# Patient Record
Sex: Male | Born: 1980 | Race: Black or African American | Hispanic: No | Marital: Single | Smoking: Former smoker
Health system: Southern US, Community
[De-identification: ages and names within clinical notes are randomized; demographics above are authoritative.]

## PROBLEM LIST (undated history)

## (undated) DIAGNOSIS — T1490XA Injury, unspecified, initial encounter: Secondary | ICD-10-CM

## (undated) DIAGNOSIS — R569 Unspecified convulsions: Secondary | ICD-10-CM

## (undated) DIAGNOSIS — W3400XA Accidental discharge from unspecified firearms or gun, initial encounter: Secondary | ICD-10-CM

## (undated) HISTORY — DX: Injury, unspecified, initial encounter: T14.90XA

---

## 1998-10-14 ENCOUNTER — Emergency Department (HOSPITAL_COMMUNITY): Admission: EM | Admit: 1998-10-14 | Discharge: 1998-10-14 | Payer: Self-pay | Admitting: Emergency Medicine

## 1998-10-15 ENCOUNTER — Emergency Department (HOSPITAL_COMMUNITY): Admission: EM | Admit: 1998-10-15 | Discharge: 1998-10-15 | Payer: Self-pay | Admitting: Emergency Medicine

## 1998-10-15 ENCOUNTER — Encounter: Payer: Self-pay | Admitting: Emergency Medicine

## 2000-05-08 HISTORY — PX: CRANIOTOMY: SHX93

## 2000-07-04 ENCOUNTER — Inpatient Hospital Stay (HOSPITAL_COMMUNITY): Admission: EM | Admit: 2000-07-04 | Discharge: 2000-07-10 | Payer: Self-pay

## 2000-07-04 ENCOUNTER — Encounter: Payer: Self-pay | Admitting: Emergency Medicine

## 2000-07-05 ENCOUNTER — Encounter: Payer: Self-pay | Admitting: General Surgery

## 2000-07-06 ENCOUNTER — Encounter: Payer: Self-pay | Admitting: General Surgery

## 2000-07-07 ENCOUNTER — Encounter: Payer: Self-pay | Admitting: General Surgery

## 2000-07-10 ENCOUNTER — Inpatient Hospital Stay (HOSPITAL_COMMUNITY)
Admission: AD | Admit: 2000-07-10 | Discharge: 2000-07-25 | Payer: Self-pay | Admitting: Physical Medicine and Rehabilitation

## 2000-07-11 ENCOUNTER — Encounter: Payer: Self-pay | Admitting: Physical Medicine and Rehabilitation

## 2000-07-14 ENCOUNTER — Encounter: Payer: Self-pay | Admitting: Physical Medicine and Rehabilitation

## 2000-07-30 ENCOUNTER — Encounter
Admission: RE | Admit: 2000-07-30 | Discharge: 2000-10-04 | Payer: Self-pay | Admitting: Physical Medicine and Rehabilitation

## 2000-09-11 ENCOUNTER — Emergency Department (HOSPITAL_COMMUNITY): Admission: EM | Admit: 2000-09-11 | Discharge: 2000-09-11 | Payer: Self-pay | Admitting: Emergency Medicine

## 2000-09-13 ENCOUNTER — Ambulatory Visit (HOSPITAL_COMMUNITY)
Admission: RE | Admit: 2000-09-13 | Discharge: 2000-09-13 | Payer: Self-pay | Admitting: Physical Medicine and Rehabilitation

## 2000-09-13 ENCOUNTER — Emergency Department (HOSPITAL_COMMUNITY): Admission: EM | Admit: 2000-09-13 | Discharge: 2000-09-13 | Payer: Self-pay | Admitting: Emergency Medicine

## 2000-10-31 ENCOUNTER — Encounter: Admission: RE | Admit: 2000-10-31 | Discharge: 2000-10-31 | Payer: Self-pay | Admitting: Neurosurgery

## 2000-10-31 ENCOUNTER — Encounter: Payer: Self-pay | Admitting: Neurosurgery

## 2000-11-20 ENCOUNTER — Inpatient Hospital Stay (HOSPITAL_COMMUNITY): Admission: RE | Admit: 2000-11-20 | Discharge: 2000-11-21 | Payer: Self-pay | Admitting: Neurosurgery

## 2001-01-02 ENCOUNTER — Ambulatory Visit (HOSPITAL_COMMUNITY): Admission: RE | Admit: 2001-01-02 | Discharge: 2001-01-02 | Payer: Self-pay | Admitting: Neurosurgery

## 2001-11-18 ENCOUNTER — Encounter: Payer: Self-pay | Admitting: *Deleted

## 2001-11-18 ENCOUNTER — Emergency Department (HOSPITAL_COMMUNITY): Admission: EM | Admit: 2001-11-18 | Discharge: 2001-11-18 | Payer: Self-pay | Admitting: Emergency Medicine

## 2002-02-09 ENCOUNTER — Emergency Department (HOSPITAL_COMMUNITY): Admission: EM | Admit: 2002-02-09 | Discharge: 2002-02-09 | Payer: Self-pay | Admitting: Emergency Medicine

## 2002-04-21 ENCOUNTER — Encounter: Payer: Self-pay | Admitting: Emergency Medicine

## 2002-04-21 ENCOUNTER — Emergency Department (HOSPITAL_COMMUNITY): Admission: EM | Admit: 2002-04-21 | Discharge: 2002-04-21 | Payer: Self-pay | Admitting: Emergency Medicine

## 2002-09-08 ENCOUNTER — Emergency Department (HOSPITAL_COMMUNITY): Admission: EM | Admit: 2002-09-08 | Discharge: 2002-09-09 | Payer: Self-pay | Admitting: Emergency Medicine

## 2002-12-19 ENCOUNTER — Encounter: Payer: Self-pay | Admitting: Emergency Medicine

## 2002-12-19 ENCOUNTER — Encounter: Payer: Self-pay | Admitting: Internal Medicine

## 2002-12-19 ENCOUNTER — Inpatient Hospital Stay (HOSPITAL_COMMUNITY): Admission: EM | Admit: 2002-12-19 | Discharge: 2002-12-22 | Payer: Self-pay | Admitting: Emergency Medicine

## 2004-03-30 ENCOUNTER — Ambulatory Visit: Payer: Self-pay | Admitting: Family Medicine

## 2004-07-29 ENCOUNTER — Ambulatory Visit: Payer: Self-pay | Admitting: Sports Medicine

## 2005-08-28 ENCOUNTER — Emergency Department (HOSPITAL_COMMUNITY): Admission: EM | Admit: 2005-08-28 | Discharge: 2005-08-28 | Payer: Self-pay | Admitting: Emergency Medicine

## 2006-01-27 ENCOUNTER — Emergency Department (HOSPITAL_COMMUNITY): Admission: EM | Admit: 2006-01-27 | Discharge: 2006-01-27 | Payer: Self-pay | Admitting: Emergency Medicine

## 2006-07-05 DIAGNOSIS — R569 Unspecified convulsions: Secondary | ICD-10-CM | POA: Insufficient documentation

## 2006-07-05 DIAGNOSIS — F79 Unspecified intellectual disabilities: Secondary | ICD-10-CM | POA: Insufficient documentation

## 2006-07-27 ENCOUNTER — Emergency Department (HOSPITAL_COMMUNITY): Admission: EM | Admit: 2006-07-27 | Discharge: 2006-07-27 | Payer: Self-pay | Admitting: Emergency Medicine

## 2006-08-08 ENCOUNTER — Ambulatory Visit: Payer: Self-pay | Admitting: Family Medicine

## 2006-08-10 ENCOUNTER — Ambulatory Visit: Payer: Self-pay | Admitting: Family Medicine

## 2008-08-13 ENCOUNTER — Emergency Department (HOSPITAL_COMMUNITY): Admission: EM | Admit: 2008-08-13 | Discharge: 2008-08-13 | Payer: Self-pay | Admitting: Emergency Medicine

## 2010-08-17 LAB — DIFFERENTIAL
Basophils Absolute: 0 10*3/uL (ref 0.0–0.1)
Basophils Relative: 1 % (ref 0–1)
Monocytes Absolute: 0.3 10*3/uL (ref 0.1–1.0)
Monocytes Relative: 5 % (ref 3–12)
Neutro Abs: 3.4 10*3/uL (ref 1.7–7.7)

## 2010-08-17 LAB — RAPID URINE DRUG SCREEN, HOSP PERFORMED
Cocaine: NOT DETECTED
Opiates: NOT DETECTED

## 2010-08-17 LAB — ETHANOL: Alcohol, Ethyl (B): 248 mg/dL — ABNORMAL HIGH (ref 0–10)

## 2010-08-17 LAB — BASIC METABOLIC PANEL
Calcium: 8.1 mg/dL — ABNORMAL LOW (ref 8.4–10.5)
GFR calc Af Amer: >60 mL/min (ref >60)
Potassium: 3.4 mEq/L — ABNORMAL LOW (ref 3.5–5.1)

## 2010-08-17 LAB — CBC
HCT: 40.8 % (ref 39.0–52.0)
MCHC: 33.9 g/dL (ref 30.0–36.0)
Platelets: 144 10*3/uL — ABNORMAL LOW (ref 150–400)
RBC: 4.46 MIL/uL (ref 4.22–5.81)
RDW: 13.6 % (ref 11.5–15.5)

## 2010-08-17 LAB — POCT I-STAT 3, ART BLOOD GAS (G3+)
Acid-base deficit: 3 mmol/L — ABNORMAL HIGH (ref 0.0–2.0)
O2 Saturation: 100 %
Patient temperature: 98.6
TCO2: 26 mmol/L (ref 0–100)
pCO2 arterial: 48.4 mmHg — ABNORMAL HIGH (ref 35.0–45.0)
pO2, Arterial: 576 mmHg — ABNORMAL HIGH (ref 80.0–100.0)

## 2010-08-17 LAB — CARBAMAZEPINE LEVEL, TOTAL: Carbamazepine Lvl: <2 ug/mL — ABNORMAL LOW (ref 4.0–12.0)

## 2010-08-17 LAB — LACTIC ACID, PLASMA: Lactic Acid, Venous: 1.7 mmol/L (ref 0.5–2.2)

## 2010-08-17 LAB — CK: Total CK: 338 U/L — ABNORMAL HIGH (ref 7–232)
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
## 2011-02-10 ENCOUNTER — Inpatient Hospital Stay (INDEPENDENT_AMBULATORY_CARE_PROVIDER_SITE_OTHER)
Admission: RE | Admit: 2011-02-10 | Discharge: 2011-02-10 | Disposition: A | Discharge status: Home / Self Care | Payer: Medicare Other | Source: Ambulatory Visit | Attending: Family Medicine | Admitting: Family Medicine

## 2011-02-10 DIAGNOSIS — S025XXA Fracture of tooth (traumatic), initial encounter for closed fracture: Secondary | ICD-10-CM

## 2011-02-10 DIAGNOSIS — K089 Disorder of teeth and supporting structures, unspecified: Secondary | ICD-10-CM

## 2011-03-15 ENCOUNTER — Encounter: Payer: Self-pay | Admitting: *Deleted

## 2011-03-15 ENCOUNTER — Emergency Department (HOSPITAL_COMMUNITY)
Admission: EM | Admit: 2011-03-15 | Discharge: 2011-03-15 | Disposition: A | Discharge status: Home / Self Care | Payer: Medicare Other | Attending: Emergency Medicine | Admitting: Emergency Medicine

## 2011-03-15 DIAGNOSIS — G40909 Epilepsy, unspecified, not intractable, without status epilepticus: Secondary | ICD-10-CM | POA: Insufficient documentation

## 2011-03-15 DIAGNOSIS — Z760 Encounter for issue of repeat prescription: Secondary | ICD-10-CM | POA: Insufficient documentation

## 2011-03-15 DIAGNOSIS — F17200 Nicotine dependence, unspecified, uncomplicated: Secondary | ICD-10-CM | POA: Insufficient documentation

## 2011-03-15 HISTORY — DX: Unspecified convulsions: R56.9

## 2011-03-15 MED ORDER — CARBAMAZEPINE 200 MG PO TABS: 200.0000 mg | ORAL_TABLET | Freq: Three times a day (TID) | ORAL | Status: DC

## 2011-03-15 MED ORDER — PHENYTOIN SODIUM EXTENDED 100 MG PO CAPS: 300.0000 mg | ORAL_CAPSULE | Freq: Every day | ORAL | Status: DC

## 2011-03-15 MED ORDER — DONEPEZIL HCL 10 MG PO TABS: 10.0000 mg | ORAL_TABLET | Freq: Every evening | ORAL | Status: AC | PRN
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
## 2011-03-15 NOTE — ED Notes (Signed)
Pt states "out of aricept x 3 days"

## 2011-03-16 ENCOUNTER — Other Ambulatory Visit: Payer: Self-pay

## 2011-03-16 ENCOUNTER — Emergency Department (HOSPITAL_COMMUNITY): Payer: Medicare Other

## 2011-03-16 ENCOUNTER — Emergency Department (HOSPITAL_COMMUNITY)
Admission: EM | Admit: 2011-03-16 | Discharge: 2011-03-16 | Disposition: A | Discharge status: Home / Self Care | Payer: Medicare Other | Attending: Emergency Medicine | Admitting: Emergency Medicine

## 2011-03-16 ENCOUNTER — Encounter (HOSPITAL_COMMUNITY): Payer: Self-pay | Admitting: Emergency Medicine

## 2011-03-16 DIAGNOSIS — J3489 Other specified disorders of nose and nasal sinuses: Secondary | ICD-10-CM | POA: Insufficient documentation

## 2011-03-16 DIAGNOSIS — K029 Dental caries, unspecified: Secondary | ICD-10-CM | POA: Insufficient documentation

## 2011-03-16 DIAGNOSIS — R279 Unspecified lack of coordination: Secondary | ICD-10-CM | POA: Insufficient documentation

## 2011-03-16 DIAGNOSIS — F17200 Nicotine dependence, unspecified, uncomplicated: Secondary | ICD-10-CM | POA: Insufficient documentation

## 2011-03-16 DIAGNOSIS — R4182 Altered mental status, unspecified: Secondary | ICD-10-CM | POA: Insufficient documentation

## 2011-03-16 DIAGNOSIS — H5789 Other specified disorders of eye and adnexa: Secondary | ICD-10-CM | POA: Insufficient documentation

## 2011-03-16 LAB — RAPID URINE DRUG SCREEN, HOSP PERFORMED
Amphetamines: NOT DETECTED
Barbiturates: NOT DETECTED
Benzodiazepines: NOT DETECTED
Cocaine: NOT DETECTED
Opiates: NOT DETECTED
Tetrahydrocannabinol: POSITIVE — AB

## 2011-03-16 LAB — CBC
Hemoglobin: 14 g/dL (ref 13.0–17.0)
MCH: 30.4 pg (ref 26.0–34.0)
MCV: 90 fL (ref 78.0–100.0)
RBC: 4.61 MIL/uL (ref 4.22–5.81)

## 2011-03-16 LAB — DIFFERENTIAL
Basophils Absolute: 0 10*3/uL (ref 0.0–0.1)
Eosinophils Relative: 1 % (ref 0–5)
Monocytes Absolute: 0.2 10*3/uL (ref 0.1–1.0)
Monocytes Relative: 4 % (ref 3–12)
Neutro Abs: 3.3 10*3/uL (ref 1.7–7.7)
Neutrophils Relative %: 71 % (ref 43–77)

## 2011-03-16 LAB — COMPREHENSIVE METABOLIC PANEL
Creatinine, Ser: 0.82 mg/dL (ref 0.50–1.35)
Potassium: 3.8 mEq/L (ref 3.5–5.1)
Total Bilirubin: 0.4 mg/dL (ref 0.3–1.2)
Total Protein: 7.2 g/dL (ref 6.0–8.3)
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
## 2011-03-16 NOTE — ED Notes (Signed)
Patient given food tray

## 2011-03-16 NOTE — ED Notes (Signed)
In to ask patient did he want to eat. He stated that his people was bringing him something to eat because he told them that he would be ready to go by 4pm. States that he is having surgery on his head and his dad is coming by to check on him due to them being on a job earlier. Informed Dr. Hyman Hopes of conversation.

## 2011-03-16 NOTE — ED Notes (Signed)
Arrived to the ER with sister. Stating that the patient had slurred speech for the last 2 days. Was seen in the ER on 03/15/11. States that he was given new meds and has had slurred speech since taking the new meds.

## 2011-03-16 NOTE — ED Notes (Signed)
Family at bedside. Gave water as patient requested.

## 2011-03-16 NOTE — ED Notes (Signed)
Pt resting at present and no signs of distress. Sister Marylene Land called to check in on him and states that she will be back later.

## 2011-03-16 NOTE — ED Notes (Signed)
Transported to CT 

## 2011-03-16 NOTE — ED Notes (Signed)
Returned from CT.

## 2011-03-21 LAB — PHENYTOIN LEVEL, FREE AND TOTAL
Phenytoin Bound: 4.6 mg/L
Phenytoin, Total: 5.8 mg/L — ABNORMAL LOW (ref 10.0–20.0)

## 2012-02-19 ENCOUNTER — Encounter (HOSPITAL_COMMUNITY): Payer: Self-pay

## 2012-02-19 ENCOUNTER — Emergency Department (HOSPITAL_COMMUNITY)
Admission: EM | Admit: 2012-02-19 | Discharge: 2012-02-19 | Disposition: A | Discharge status: Home / Self Care | Payer: Medicare Other | Attending: Emergency Medicine | Admitting: Emergency Medicine

## 2012-02-19 DIAGNOSIS — Z87820 Personal history of traumatic brain injury: Secondary | ICD-10-CM | POA: Insufficient documentation

## 2012-02-19 DIAGNOSIS — Z23 Encounter for immunization: Secondary | ICD-10-CM | POA: Insufficient documentation

## 2012-02-19 DIAGNOSIS — S0180XA Unspecified open wound of other part of head, initial encounter: Secondary | ICD-10-CM | POA: Insufficient documentation

## 2012-02-19 DIAGNOSIS — X58XXXA Exposure to other specified factors, initial encounter: Secondary | ICD-10-CM | POA: Insufficient documentation

## 2012-02-19 DIAGNOSIS — Z79899 Other long term (current) drug therapy: Secondary | ICD-10-CM | POA: Insufficient documentation

## 2012-02-19 DIAGNOSIS — IMO0002 Reserved for concepts with insufficient information to code with codable children: Secondary | ICD-10-CM

## 2012-02-19 MED ORDER — TRAMADOL HCL 50 MG PO TABS
50.0000 mg | ORAL_TABLET | Freq: Once | ORAL | Status: DC
Start: 2012-02-19 — End: 2012-02-19

## 2012-02-19 MED ORDER — TETANUS-DIPHTH-ACELL PERTUSSIS 5-2.5-18.5 LF-MCG/0.5 IM SUSP
0.5000 mL | Freq: Once | INTRAMUSCULAR | Status: AC
  Administered 2012-02-19: 0.5 mL via INTRAMUSCULAR
  Filled 2012-02-19: qty 0.5

## 2012-02-19 MED ORDER — DEXAMETHASONE 6 MG PO TABS
6.0000 mg | ORAL_TABLET | Freq: Once | ORAL | Status: DC
  Filled 2012-02-19: qty 1

## 2012-02-19 MED ORDER — KETOROLAC TROMETHAMINE 30 MG/ML IJ SOLN: 30.0000 mg | Freq: Once | INTRAMUSCULAR | Status: DC

## 2012-02-19 MED ORDER — ONDANSETRON 4 MG PO TBDP: 4.0000 mg | ORAL_TABLET | Freq: Once | ORAL | Status: DC
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
## 2012-02-19 NOTE — ED Provider Notes (Signed)
Medical screening examination/treatment/procedure(s) were performed by non-physician practitioner and as supervising physician I was immediately available for consultation/collaboration.    Yann Biehn R Cheila Wickstrom, MD 02/19/12 1612 
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
## 2012-11-25 ENCOUNTER — Encounter: Payer: Self-pay | Admitting: Neurology

## 2012-11-26 ENCOUNTER — Ambulatory Visit: Payer: Self-pay | Admitting: Neurology

## 2013-04-22 ENCOUNTER — Ambulatory Visit: Payer: Self-pay | Admitting: Neurology

## 2013-05-12 ENCOUNTER — Telehealth: Payer: Self-pay | Admitting: *Deleted

## 2013-06-25 ENCOUNTER — Ambulatory Visit: Payer: Self-pay | Admitting: Neurology

## 2017-02-19 NOTE — Telephone Encounter (Signed)
Close Encounter 

## 2018-06-08 ENCOUNTER — Encounter (HOSPITAL_COMMUNITY): Payer: Self-pay | Admitting: Emergency Medicine

## 2018-06-08 ENCOUNTER — Emergency Department (HOSPITAL_COMMUNITY)
Admission: EM | Admit: 2018-06-08 | Discharge: 2018-06-08 | Disposition: A | Discharge status: Home / Self Care | Payer: Medicare Other | Attending: Emergency Medicine | Admitting: Emergency Medicine

## 2018-06-08 ENCOUNTER — Other Ambulatory Visit: Payer: Self-pay

## 2018-06-08 ENCOUNTER — Emergency Department (HOSPITAL_COMMUNITY): Payer: Medicare Other

## 2018-06-08 DIAGNOSIS — R51 Headache: Secondary | ICD-10-CM | POA: Diagnosis not present

## 2018-06-08 DIAGNOSIS — W19XXXA Unspecified fall, initial encounter: Secondary | ICD-10-CM

## 2018-06-08 LAB — URINALYSIS, ROUTINE W REFLEX MICROSCOPIC
Bilirubin Urine: NEGATIVE
GLUCOSE, UA: NEGATIVE mg/dL
HGB URINE DIPSTICK: NEGATIVE
Ketones, ur: 80 mg/dL — AB
Leukocytes, UA: NEGATIVE
NITRITE: NEGATIVE
PH: 5 (ref 5.0–8.0)
Protein, ur: 30 mg/dL — AB
SPECIFIC GRAVITY, URINE: 1.027 (ref 1.005–1.030)

## 2018-06-08 LAB — RAPID URINE DRUG SCREEN, HOSP PERFORMED
AMPHETAMINES: NOT DETECTED
Barbiturates: NOT DETECTED
Benzodiazepines: NOT DETECTED
Cocaine: NOT DETECTED
Opiates: NOT DETECTED
TETRAHYDROCANNABINOL: POSITIVE — AB

## 2018-06-08 LAB — CBC
HEMATOCRIT: 45.4 % (ref 39.0–52.0)
HEMOGLOBIN: 14.3 g/dL (ref 13.0–17.0)
MCH: 29.9 pg (ref 26.0–34.0)
MCHC: 31.5 g/dL (ref 30.0–36.0)
MCV: 94.8 fL (ref 80.0–100.0)
Platelets: 174 10*3/uL (ref 150–400)
RBC: 4.79 MIL/uL (ref 4.22–5.81)
RDW: 12.7 % (ref 11.5–15.5)
WBC: 5.1 10*3/uL (ref 4.0–10.5)
nRBC: 0 % (ref 0.0–0.2)

## 2018-06-08 LAB — COMPREHENSIVE METABOLIC PANEL
ALBUMIN: 4.3 g/dL (ref 3.5–5.0)
ALK PHOS: 46 U/L (ref 38–126)
ALT: 15 U/L (ref 0–44)
ANION GAP: 11 (ref 5–15)
AST: 15 U/L (ref 15–41)
BILIRUBIN TOTAL: 0.5 mg/dL (ref 0.3–1.2)
BUN: 10 mg/dL (ref 6–20)
CALCIUM: 9.2 mg/dL (ref 8.9–10.3)
CO2: 26 mmol/L (ref 22–32)
Chloride: 103 mmol/L (ref 98–111)
Creatinine, Ser: 0.87 mg/dL (ref 0.61–1.24)
GFR calc Af Amer: >60 mL/min (ref >60)
GLUCOSE: 105 mg/dL — AB (ref 70–99)
Potassium: 3.7 mmol/L (ref 3.5–5.1)
Sodium: 140 mmol/L (ref 135–145)
TOTAL PROTEIN: 7.2 g/dL (ref 6.5–8.1)

## 2018-06-08 LAB — CARBAMAZEPINE LEVEL, TOTAL

## 2018-06-08 LAB — TROPONIN I: Troponin I: <0.03 ng/mL (ref <0.03)

## 2018-06-08 LAB — PHENYTOIN LEVEL, TOTAL: Phenytoin Lvl: 43.8 ug/mL (ref 10.0–20.0)

## 2018-06-08 MED ORDER — DIPHENHYDRAMINE HCL 50 MG/ML IJ SOLN
25.0000 mg | Freq: Once | INTRAMUSCULAR | Status: AC
  Administered 2018-06-08: 25 mg via INTRAVENOUS
  Filled 2018-06-08: qty 1

## 2018-06-08 MED ORDER — PROCHLORPERAZINE EDISYLATE 10 MG/2ML IJ SOLN
10.0000 mg | Freq: Once | INTRAMUSCULAR | Status: AC
  Administered 2018-06-08: 10 mg via INTRAVENOUS
  Filled 2018-06-08: qty 2

## 2018-06-08 MED ORDER — CARBAMAZEPINE 200 MG PO TABS
200.0000 mg | ORAL_TABLET | Freq: Once | ORAL | Status: AC
  Administered 2018-06-08: 200 mg via ORAL
  Filled 2018-06-08: qty 1

## 2018-06-08 MED ORDER — SODIUM CHLORIDE 0.9 % IV BOLUS
1000.0000 mL | Freq: Once | INTRAVENOUS | Status: AC
  Administered 2018-06-08: 1000 mL via INTRAVENOUS

## 2018-06-08 MED ORDER — PHENYTOIN SODIUM EXTENDED 100 MG PO CAPS
100.0000 mg | ORAL_CAPSULE | Freq: Once | ORAL | Status: AC
  Administered 2018-06-08: 100 mg via ORAL
  Filled 2018-06-08: qty 1

## 2018-06-08 NOTE — Discharge Instructions (Addendum)
Procedures  You were evaluated in the Emergency Department and after careful evaluation, we did not find any emergent condition requiring admission or further testing in the hospital.  Your Dilantin levels were high and your Tegretol levels were low.  Please follow-up with your regular prescriber to adjust your doses.  Please return to the Emergency Department if you experience any worsening of your condition.  We encourage you to follow up with a primary care provider.  Thank you for allowing [place] to be a part of your care.
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
## 2018-06-08 NOTE — ED Notes (Signed)
Patient verbalizes understanding of discharge instructions. Opportunity for questioning and answers were provided. Armband removed by staff, pt discharged from ED.  

## 2018-06-08 NOTE — ED Triage Notes (Signed)
Patient reports falling the morning. Headache reported after the fall. Vomited twice yesterday, none today. Headache still present History of seizures, states that he did not take medication today because of headache. Denies hitting head, denies loss of consciousness. Reports frontal headache.

## 2018-06-08 NOTE — ED Notes (Signed)
Patient transported to CT 

## 2018-06-08 NOTE — ED Notes (Signed)
Patient aware that we need a urine sample. 

## 2018-06-08 NOTE — ED Notes (Signed)
ED Provider, Bero at bedside.

## 2020-03-25 ENCOUNTER — Emergency Department (HOSPITAL_BASED_OUTPATIENT_CLINIC_OR_DEPARTMENT_OTHER): Payer: Medicare Other

## 2020-03-25 ENCOUNTER — Emergency Department (HOSPITAL_BASED_OUTPATIENT_CLINIC_OR_DEPARTMENT_OTHER)
Admission: EM | Admit: 2020-03-25 | Discharge: 2020-03-25 | Disposition: A | Discharge status: Home / Self Care | Payer: Medicare Other | Attending: Emergency Medicine | Admitting: Emergency Medicine

## 2020-03-25 ENCOUNTER — Other Ambulatory Visit: Payer: Self-pay

## 2020-03-25 ENCOUNTER — Encounter (HOSPITAL_BASED_OUTPATIENT_CLINIC_OR_DEPARTMENT_OTHER): Payer: Self-pay

## 2020-03-25 DIAGNOSIS — Z87891 Personal history of nicotine dependence: Secondary | ICD-10-CM | POA: Diagnosis not present

## 2020-03-25 DIAGNOSIS — R531 Weakness: Secondary | ICD-10-CM | POA: Insufficient documentation

## 2020-03-25 DIAGNOSIS — R252 Cramp and spasm: Secondary | ICD-10-CM

## 2020-03-25 DIAGNOSIS — Z79899 Other long term (current) drug therapy: Secondary | ICD-10-CM | POA: Diagnosis not present

## 2020-03-25 LAB — COMPREHENSIVE METABOLIC PANEL
ALT: 17 U/L (ref 0–44)
AST: 18 U/L (ref 15–41)
Albumin: 3.8 g/dL (ref 3.5–5.0)
Alkaline Phosphatase: 40 U/L (ref 38–126)
Anion gap: 9 (ref 5–15)
BUN: 14 mg/dL (ref 6–20)
CO2: 24 mmol/L (ref 22–32)
Calcium: 8.7 mg/dL — ABNORMAL LOW (ref 8.9–10.3)
Chloride: 103 mmol/L (ref 98–111)
Creatinine, Ser: 0.85 mg/dL (ref 0.61–1.24)
GFR, Estimated: >60 mL/min (ref >60)
Glucose, Bld: 90 mg/dL (ref 70–99)
Potassium: 4.5 mmol/L (ref 3.5–5.1)
Sodium: 136 mmol/L (ref 135–145)
Total Bilirubin: 0.4 mg/dL (ref 0.3–1.2)
Total Protein: 6 g/dL — ABNORMAL LOW (ref 6.5–8.1)

## 2020-03-25 LAB — RAPID URINE DRUG SCREEN, HOSP PERFORMED
Amphetamines: NOT DETECTED
Barbiturates: NOT DETECTED
Benzodiazepines: NOT DETECTED
Cocaine: NOT DETECTED
Opiates: NOT DETECTED
Tetrahydrocannabinol: POSITIVE — AB

## 2020-03-25 LAB — CBC WITH DIFFERENTIAL/PLATELET
Abs Immature Granulocytes: 0.01 10*3/uL (ref 0.00–0.07)
Basophils Absolute: 0 10*3/uL (ref 0.0–0.1)
Basophils Relative: 1 %
Eosinophils Absolute: 0.1 10*3/uL (ref 0.0–0.5)
Eosinophils Relative: 2 %
HCT: 40.5 % (ref 39.0–52.0)
Hemoglobin: 13.3 g/dL (ref 13.0–17.0)
Immature Granulocytes: 0 %
Lymphocytes Relative: 39 %
Lymphs Abs: 1.2 10*3/uL (ref 0.7–4.0)
MCH: 31.1 pg (ref 26.0–34.0)
MCHC: 32.8 g/dL (ref 30.0–36.0)
MCV: 94.8 fL (ref 80.0–100.0)
Monocytes Absolute: 0.3 10*3/uL (ref 0.1–1.0)
Monocytes Relative: 9 %
Neutro Abs: 1.5 10*3/uL — ABNORMAL LOW (ref 1.7–7.7)
Neutrophils Relative %: 49 %
Platelets: 178 10*3/uL (ref 150–400)
RBC: 4.27 MIL/uL (ref 4.22–5.81)
RDW: 13 % (ref 11.5–15.5)
WBC: 3.1 10*3/uL — ABNORMAL LOW (ref 4.0–10.5)
nRBC: 0 % (ref 0.0–0.2)

## 2020-03-25 LAB — URINALYSIS, ROUTINE W REFLEX MICROSCOPIC
Bilirubin Urine: NEGATIVE
Glucose, UA: NEGATIVE mg/dL
Hgb urine dipstick: NEGATIVE
Ketones, ur: NEGATIVE mg/dL
Leukocytes,Ua: NEGATIVE
Nitrite: NEGATIVE
Protein, ur: NEGATIVE mg/dL
Specific Gravity, Urine: 1.015 (ref 1.005–1.030)
pH: 6.5 (ref 5.0–8.0)

## 2020-03-25 LAB — CARBAMAZEPINE LEVEL, TOTAL: Carbamazepine Lvl: 11.1 ug/mL (ref 4.0–12.0)

## 2020-03-25 LAB — ETHANOL: Alcohol, Ethyl (B): <10 mg/dL (ref <10)

## 2020-03-25 LAB — PHENYTOIN LEVEL, TOTAL: Phenytoin Lvl: 11.1 ug/mL (ref 10.0–20.0)

## 2020-03-25 MED ORDER — SODIUM CHLORIDE 0.9 % IV BOLUS
1000.0000 mL | Freq: Once | INTRAVENOUS | Status: AC
Start: 2020-03-25 — End: 2020-03-25
  Administered 2020-03-25: 1000 mL via INTRAVENOUS

## 2020-03-25 NOTE — Discharge Instructions (Signed)
Continue your current medicines.  Your levels are appropriate right now.  See your primary care doctor and consider following up with a neurologist.  Return to ER if you have seizures, legs giving out, worse cramps.

## 2020-03-25 NOTE — ED Notes (Signed)
Called lab re: CMP not resulted; per Cordelia Pen in lab, CMP tube was inadvertently sent to [facility] with other labs that were send-outs; the CMP is in progress, but will be delayed.

## 2020-03-25 NOTE — ED Provider Notes (Signed)
  Physical Exam  BP (!) 128/91   Pulse (!) 58   Temp 98.8 F (37.1 C) (Oral)   Resp 16   SpO2 100%   Physical Exam  ED Course/Procedures     Procedures  MDM  Care assumed at 3 pm from Dr. Judd Lien.  Patient's legs been giving out on him.  He had previous history of elevated Dilantin level previously.  No mild clonus on exam.  Patient is ambulatory at bedside.  Signout pending Dilantin and Tegretol levels.  3:56 PM Dilantin and Tegretol levels are both therapeutic.  Patient is ambulatory and is well-appearing.  Stable for discharge home.  Recommend neurology referral.  He states that he will talk to his primary care doctor and get referred that way.       Charlynne Pander, MD 03/25/20 1556
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
## 2020-03-25 NOTE — ED Triage Notes (Signed)
Pt c/o "legs giving out" started yesterday-pt states he fell on a curb and was brought home by police "early this morning"-denies pain from fall-father with pt-NAD-to triage in w/c

## 2020-10-10 ENCOUNTER — Other Ambulatory Visit: Payer: Self-pay

## 2020-10-10 ENCOUNTER — Emergency Department (HOSPITAL_BASED_OUTPATIENT_CLINIC_OR_DEPARTMENT_OTHER)
Admission: EM | Admit: 2020-10-10 | Discharge: 2020-10-10 | Disposition: A | Discharge status: Home / Self Care | Payer: Medicare Other | Attending: Emergency Medicine | Admitting: Emergency Medicine

## 2020-10-10 ENCOUNTER — Encounter (HOSPITAL_BASED_OUTPATIENT_CLINIC_OR_DEPARTMENT_OTHER): Payer: Self-pay | Admitting: *Deleted

## 2020-10-10 DIAGNOSIS — Z87891 Personal history of nicotine dependence: Secondary | ICD-10-CM | POA: Insufficient documentation

## 2020-10-10 DIAGNOSIS — R519 Headache, unspecified: Secondary | ICD-10-CM | POA: Insufficient documentation

## 2020-10-10 HISTORY — DX: Accidental discharge from unspecified firearms or gun, initial encounter: W34.00XA

## 2020-10-10 MED ORDER — DEXAMETHASONE 6 MG PO TABS
10.0000 mg | ORAL_TABLET | Freq: Once | ORAL | Status: AC
  Administered 2020-10-10: 10 mg via ORAL
  Filled 2020-10-10: qty 1

## 2020-10-10 MED ORDER — PROCHLORPERAZINE EDISYLATE 10 MG/2ML IJ SOLN
10.0000 mg | Freq: Once | INTRAMUSCULAR | Status: AC
Start: 2020-10-10 — End: 2020-10-10
  Administered 2020-10-10: 10 mg via INTRAMUSCULAR
  Filled 2020-10-10: qty 2

## 2020-10-10 MED ORDER — ACETAMINOPHEN 325 MG PO TABS
650.0000 mg | ORAL_TABLET | Freq: Once | ORAL | Status: AC
  Administered 2020-10-10: 650 mg via ORAL
  Filled 2020-10-10: qty 2

## 2020-10-10 MED ORDER — DIPHENHYDRAMINE HCL 25 MG PO CAPS
50.0000 mg | ORAL_CAPSULE | Freq: Once | ORAL | Status: AC
  Administered 2020-10-10: 50 mg via ORAL
  Filled 2020-10-10: qty 2
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
## 2021-06-08 ENCOUNTER — Encounter: Payer: Self-pay | Admitting: Neurology

## 2021-06-08 ENCOUNTER — Ambulatory Visit (INDEPENDENT_AMBULATORY_CARE_PROVIDER_SITE_OTHER): Payer: Medicare Other | Admitting: Neurology

## 2021-06-08 ENCOUNTER — Other Ambulatory Visit: Payer: Self-pay

## 2021-06-08 VITALS — BP 150/92 | HR 50 | Ht 67.0 in | Wt 163.6 lb

## 2021-06-08 DIAGNOSIS — G40209 Localization-related (focal) (partial) symptomatic epilepsy and epileptic syndromes with complex partial seizures, not intractable, without status epilepticus: Secondary | ICD-10-CM | POA: Diagnosis not present

## 2021-06-08 DIAGNOSIS — Z87820 Personal history of traumatic brain injury: Secondary | ICD-10-CM

## 2021-06-08 MED ORDER — PHENYTOIN SODIUM EXTENDED 100 MG PO CAPS: ORAL_CAPSULE | ORAL | 11 refills | Status: AC

## 2021-06-08 MED ORDER — CARBAMAZEPINE 200 MG PO TABS: ORAL_TABLET | ORAL | 11 refills | Status: AC
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
## 2022-06-08 ENCOUNTER — Ambulatory Visit: Payer: 59 | Admitting: Neurology
